# Patient Record
Sex: Female | Born: 1970 | Race: Black or African American | Hispanic: No | Marital: Single | Smoking: Never smoker
Health system: Southern US, Community
[De-identification: ages and names within clinical notes are randomized; demographics above are authoritative.]

## PROBLEM LIST (undated history)

## (undated) DIAGNOSIS — I10 Essential (primary) hypertension: Secondary | ICD-10-CM

## (undated) DIAGNOSIS — I809 Phlebitis and thrombophlebitis of unspecified site: Secondary | ICD-10-CM

## (undated) HISTORY — PX: ABDOMINAL HYSTERECTOMY: SHX81

## (undated) HISTORY — DX: Phlebitis and thrombophlebitis of unspecified site: I80.9

## (undated) HISTORY — DX: Essential (primary) hypertension: I10

---

## 1999-09-06 ENCOUNTER — Ambulatory Visit (HOSPITAL_COMMUNITY): Admission: RE | Admit: 1999-09-06 | Discharge: 1999-09-06 | Payer: Self-pay | Admitting: Surgery

## 2000-05-06 ENCOUNTER — Other Ambulatory Visit: Admission: RE | Admit: 2000-05-06 | Discharge: 2000-05-06 | Payer: Self-pay | Admitting: Gynecology

## 2000-11-13 ENCOUNTER — Emergency Department (HOSPITAL_COMMUNITY): Admission: EM | Admit: 2000-11-13 | Discharge: 2000-11-13 | Payer: Self-pay | Admitting: Emergency Medicine

## 2000-11-14 ENCOUNTER — Encounter: Payer: Self-pay | Admitting: Emergency Medicine

## 2002-12-29 ENCOUNTER — Other Ambulatory Visit: Admission: RE | Admit: 2002-12-29 | Discharge: 2002-12-29 | Payer: Self-pay | Admitting: Gynecology

## 2003-05-17 ENCOUNTER — Inpatient Hospital Stay (HOSPITAL_COMMUNITY): Admission: RE | Admit: 2003-05-17 | Discharge: 2003-05-19 | Payer: Self-pay | Admitting: Gynecology

## 2003-11-30 ENCOUNTER — Encounter: Admission: RE | Admit: 2003-11-30 | Discharge: 2003-11-30 | Payer: Self-pay | Admitting: Otolaryngology

## 2004-11-08 ENCOUNTER — Other Ambulatory Visit: Admission: RE | Admit: 2004-11-08 | Discharge: 2004-11-08 | Payer: Self-pay | Admitting: Gynecology

## 2005-11-18 HISTORY — PX: TONSILLECTOMY: SUR1361

## 2006-03-05 ENCOUNTER — Other Ambulatory Visit: Admission: RE | Admit: 2006-03-05 | Discharge: 2006-03-05 | Payer: Self-pay | Admitting: Gynecology

## 2011-02-04 ENCOUNTER — Ambulatory Visit (HOSPITAL_COMMUNITY): Admission: RE | Admit: 2011-02-04 | Payer: Self-pay | Source: Ambulatory Visit

## 2011-02-05 ENCOUNTER — Ambulatory Visit (HOSPITAL_COMMUNITY)
Admission: RE | Admit: 2011-02-05 | Discharge: 2011-02-05 | Disposition: A | Discharge status: Home / Self Care | Payer: 59 | Source: Ambulatory Visit | Attending: Specialist | Admitting: Specialist

## 2011-02-05 DIAGNOSIS — M7989 Other specified soft tissue disorders: Secondary | ICD-10-CM | POA: Insufficient documentation

## 2011-02-05 DIAGNOSIS — M79609 Pain in unspecified limb: Secondary | ICD-10-CM | POA: Insufficient documentation

## 2011-02-05 DIAGNOSIS — R52 Pain, unspecified: Secondary | ICD-10-CM

## 2011-05-28 ENCOUNTER — Encounter: Payer: Self-pay | Admitting: Surgery

## 2011-06-07 ENCOUNTER — Encounter: Payer: Self-pay | Admitting: Surgery

## 2011-06-10 ENCOUNTER — Ambulatory Visit (INDEPENDENT_AMBULATORY_CARE_PROVIDER_SITE_OTHER): Payer: 59 | Admitting: Surgery

## 2011-06-10 ENCOUNTER — Encounter: Payer: Self-pay | Admitting: Surgery

## 2011-06-10 ENCOUNTER — Ambulatory Visit (INDEPENDENT_AMBULATORY_CARE_PROVIDER_SITE_OTHER): Payer: 59 | Admitting: Vascular Surgery

## 2011-06-10 VITALS — BP 147/93 | HR 74 | Resp 16 | Ht 65.0 in | Wt 323.1 lb

## 2011-06-10 DIAGNOSIS — M7989 Other specified soft tissue disorders: Secondary | ICD-10-CM

## 2011-06-10 DIAGNOSIS — I8000 Phlebitis and thrombophlebitis of superficial vessels of unspecified lower extremity: Secondary | ICD-10-CM | POA: Insufficient documentation

## 2011-06-10 DIAGNOSIS — M79609 Pain in unspecified limb: Secondary | ICD-10-CM

## 2011-06-10 NOTE — Progress Notes (Signed)
[facility]   Patient name: Adriana Mcintosh MRN: [id number] DOB: 1970/11/08 Sex: female   Referred by: Dr. Shelle Iron  Reason for referral:  Chief Complaint  Patient presents with  . New Evaluation    Sup Phelbitis of right leg-off/on 2009. Getting worse   , HISTORY OF PRESENT ILLNESS: The patient is sent over for evaluation of right leg swelling. The patient states that she bumped her leg on an elliptical machine approximately 3 years ago and has been having problems with swelling ever sense. She states that her swelling is improved with leg elevation as well as compression from an Ace wrap. It is worse when she is on her feet all day. She has had an MRI which revealed an area consistent with a phlebitis and surrounding fluid. She is sent over for further evaluation of her venous system. She has had a Doppler study which was negative for DVT. Her other medical conditions include hypertension, obesity.  Past Medical History  Diagnosis Date  . Hypertension   . Phlebitis, superficial     chronic    Past Surgical History  Procedure Date  . Abdominal hysterectomy   . Tonsillectomy Oct. 2007  . Cesarean section     History   Social History  . Marital Status: Single    Spouse Name: N/A    Number of Children: N/A  . Years of Education: N/A   Occupational History  . Not on file.   Social History Main Topics  . Smoking status: Never Smoker   . Smokeless tobacco: Not on file  . Alcohol Use: 0.6 oz/week    1 Glasses of wine per week  . Drug Use: No  . Sexually Active:    Other Topics Concern  . Not on file   Social History Narrative  . No narrative on file    Family History  Problem Relation Age of Onset  . Hypertension Mother   . Stroke Father   . Diabetes Father   . Heart disease Father   . Hypertension Father   . Diabetes Brother     Allergies as of 06/10/2011 - Review Complete 06/10/2011  Allergen Reaction Noted  . Levaquin   05/28/2011    Current Outpatient Prescriptions on File Prior to Visit  Medication Sig Dispense Refill  . lisinopril-hydrochlorothiazide (PRINZIDE,ZESTORETIC) 10-12.5 MG per tablet Take 1 tablet by mouth daily.         REVIEW OF SYSTEMS: Review of systems positive for swelling in her legs and varicose veins. Otherwise, negative as documented in the encounter for  PHYSICAL EXAMINATION: General: The patient appears their stated age.  Vital signs are BP 147/93  Pulse 74  Resp 16  Ht 5\' 5"  (1.651 m)  Wt 323 lb 1.6 oz (146.557 kg)  BMI 53.77 kg/m2  SpO2 100% HEENT:  No gross abnormalities Pulmonary: Respirations are non-labored Musculoskeletal: There are no major deformities.   Neurologic: No focal weakness or paresthesias are detected, Skin: There are no ulcer or rashes noted. Psychiatric: The patient has normal affect. Cardiovascular: Palpable dorsalis pedis pulse on the right. Swelling on the medial half of the right lower leg  Diagnostic Studies: Venous ultrasound was performed which showed no evidence of deep vein thrombosis on the right. There was reflux in the right common femoral vein however the greater and lesser saphenous veins were competent.    Medication Changes: None  Assessment:  Right leg swelling Plan: With a venous ultrasound which did not show  evidence of superficial venous reflux, I feel that her symptoms are most likely consistent with lymphedema, especially given her history of trauma 3 years ago which is when she developed the onset of her symptoms. I discussed that the main stay of treatment for this is going to be elevation and compression. I have recommended compression stockings however she did not wish to have these at this time. Because of the somewhat focal nature of her swelling, she may benefit from consultation with lymphedema therapist. I have made an appointment for her. She will followup with me on a when necessary basis     V. Charlena Cross, M.D. [facility] [place] Office: [phone] Pager:  [phone]

## 2011-06-24 NOTE — Procedures (Unsigned)
DUPLEX DEEP VENOUS EXAM - LOWER EXTREMITY  INDICATION:  Right lower extremity pain and swelling.  HISTORY:  Edema:  Yes. Trauma/Surgery:  Injury on exercise equipment, unknown date. Pain:  Yes. PE:  No. Previous DVT:  No. Anticoagulants:  No. Other:  DUPLEX EXAM:               CFV   SFV   PopV   PTV   GSV               R  L  R  L  R   L  R  L  R  L Thrombosis    0  0  0     0      0     0 Spontaneous   +  +  +     +      +     + Phasic        +  +  +     +      +     + Augmentation  +  +  +     +      +     + Compressible  +  +  +.    +      +     + Competent     0  +  +     +      +     +  Legend:  + - yes  o - no  p - partial  D - decreased   IMPRESSION: 1. No evidence of deep vein thrombosis identified in the right lower     extremity. 2. Deep venous reflux present involving the right common femoral vein     of >500 milliseconds. 3. Patent and competent right greater and small saphenous veins. 4. Patent and compressible contralateral common femoral vein.        _____________________________ V. Charlena Cross, MD  SH/MEDQ  D:  06/10/2011  T:  06/10/2011  Job:  [id number]

## 2013-12-28 ENCOUNTER — Ambulatory Visit: Payer: 59

## 2014-01-04 ENCOUNTER — Ambulatory Visit: Payer: 59

## 2014-01-11 ENCOUNTER — Ambulatory Visit: Payer: 59

## 2018-03-09 ENCOUNTER — Other Ambulatory Visit: Payer: Self-pay | Admitting: Family Medicine

## 2018-03-09 DIAGNOSIS — Z1231 Encounter for screening mammogram for malignant neoplasm of breast: Secondary | ICD-10-CM

## 2018-04-03 ENCOUNTER — Ambulatory Visit
Admission: RE | Admit: 2018-04-03 | Discharge: 2018-04-03 | Disposition: A | Discharge status: Home / Self Care | Payer: Managed Care, Other (non HMO) | Source: Ambulatory Visit | Attending: Family Medicine | Admitting: Family Medicine

## 2018-04-03 DIAGNOSIS — Z1231 Encounter for screening mammogram for malignant neoplasm of breast: Secondary | ICD-10-CM

## 2019-03-31 ENCOUNTER — Other Ambulatory Visit: Payer: Self-pay | Admitting: Family Medicine

## 2019-03-31 DIAGNOSIS — Z1231 Encounter for screening mammogram for malignant neoplasm of breast: Secondary | ICD-10-CM

## 2019-04-06 ENCOUNTER — Emergency Department (HOSPITAL_COMMUNITY)
Admission: EM | Admit: 2019-04-06 | Discharge: 2019-04-07 | Disposition: A | Discharge status: Home / Self Care | Payer: Managed Care, Other (non HMO) | Attending: Emergency Medicine | Admitting: Emergency Medicine

## 2019-04-06 ENCOUNTER — Emergency Department (HOSPITAL_COMMUNITY): Payer: Managed Care, Other (non HMO)

## 2019-04-06 ENCOUNTER — Other Ambulatory Visit: Payer: Self-pay

## 2019-04-06 ENCOUNTER — Encounter (HOSPITAL_COMMUNITY): Payer: Self-pay | Admitting: Emergency Medicine

## 2019-04-06 DIAGNOSIS — R202 Paresthesia of skin: Secondary | ICD-10-CM | POA: Diagnosis not present

## 2019-04-06 DIAGNOSIS — G43909 Migraine, unspecified, not intractable, without status migrainosus: Secondary | ICD-10-CM

## 2019-04-06 DIAGNOSIS — I10 Essential (primary) hypertension: Secondary | ICD-10-CM

## 2019-04-06 DIAGNOSIS — R609 Edema, unspecified: Secondary | ICD-10-CM | POA: Insufficient documentation

## 2019-04-06 DIAGNOSIS — R200 Anesthesia of skin: Secondary | ICD-10-CM | POA: Diagnosis present

## 2019-04-06 LAB — DIFFERENTIAL
Abs Immature Granulocytes: 0.02 10*3/uL (ref 0.00–0.07)
Basophils Absolute: 0 10*3/uL (ref 0.0–0.1)
Basophils Relative: 1 %
Eosinophils Absolute: 0.1 10*3/uL (ref 0.0–0.5)
Eosinophils Relative: 1 %
Immature Granulocytes: 0 %
Lymphocytes Relative: 48 %
Lymphs Abs: 3.6 10*3/uL (ref 0.7–4.0)
Monocytes Absolute: 0.5 10*3/uL (ref 0.1–1.0)
Monocytes Relative: 6 %
Neutro Abs: 3.4 10*3/uL (ref 1.7–7.7)
Neutrophils Relative %: 44 %

## 2019-04-06 LAB — CBC
HCT: 41 % (ref 36.0–46.0)
Hemoglobin: 13 g/dL (ref 12.0–15.0)
MCH: 28.6 pg (ref 26.0–34.0)
MCHC: 31.7 g/dL (ref 30.0–36.0)
MCV: 90.3 fL (ref 80.0–100.0)
Platelets: 405 10*3/uL — ABNORMAL HIGH (ref 150–400)
RBC: 4.54 MIL/uL (ref 3.87–5.11)
RDW: 12.9 % (ref 11.5–15.5)
WBC: 7.6 10*3/uL (ref 4.0–10.5)
nRBC: 0 % (ref 0.0–0.2)

## 2019-04-06 LAB — APTT: aPTT: 32 seconds (ref 24–36)

## 2019-04-06 LAB — PROTIME-INR
INR: 1 (ref 0.8–1.2)
Prothrombin Time: 12.6 seconds (ref 11.4–15.2)

## 2019-04-06 MED ORDER — SODIUM CHLORIDE 0.9% FLUSH
3.0000 mL | Freq: Once | INTRAVENOUS | Status: DC
Start: 2019-04-06 — End: 2019-04-07

## 2019-04-06 NOTE — ED Triage Notes (Signed)
Patient reports brief numbness at left face and left arm this evening ( 9PM) , alert and oriented at arrival , speech clear/ no facial asymmetry , equal strong grips with no arm drift , alert and oriented/respirations unlabored.

## 2019-04-07 LAB — COMPREHENSIVE METABOLIC PANEL
ALT: 17 U/L (ref 0–44)
AST: 13 U/L — ABNORMAL LOW (ref 15–41)
Albumin: 4.3 g/dL (ref 3.5–5.0)
Alkaline Phosphatase: 49 U/L (ref 38–126)
Anion gap: 13 (ref 5–15)
BUN: 12 mg/dL (ref 6–20)
CO2: 25 mmol/L (ref 22–32)
Calcium: 9.4 mg/dL (ref 8.9–10.3)
Chloride: 102 mmol/L (ref 98–111)
Creatinine, Ser: 0.75 mg/dL (ref 0.44–1.00)
GFR calc Af Amer: >60 mL/min (ref >60)
GFR calc non Af Amer: >60 mL/min (ref >60)
Glucose, Bld: 190 mg/dL — ABNORMAL HIGH (ref 70–99)
Potassium: 3.6 mmol/L (ref 3.5–5.1)
Sodium: 140 mmol/L (ref 135–145)
Total Bilirubin: 0.5 mg/dL (ref 0.3–1.2)
Total Protein: 7.1 g/dL (ref 6.5–8.1)

## 2019-04-07 MED ORDER — KETOROLAC TROMETHAMINE 30 MG/ML IJ SOLN
30.0000 mg | Freq: Once | INTRAMUSCULAR | Status: AC
  Administered 2019-04-07: 30 mg via INTRAMUSCULAR
  Filled 2019-04-07: qty 1

## 2019-04-07 NOTE — ED Provider Notes (Signed)
[facility] EMERGENCY DEPARTMENT Provider Note   CSN: [id number] Arrival date & time: 04/06/19  2210     History Chief Complaint  Patient presents with  . Facial Numbness    Adriana Mcintosh is a 49 y.o. female.  HPI     This is a 49 year old female with a history of hypertension who presents with numbness.  Patient reports that last night after giving her husband a bath she noted left hand numbness.  She states that it migrated up her left arm and she had some numbness over the left side of her lips.  She states that the numbness lasted approximately 15 minutes.  It resolved in route here.  She did not have any headache at that time but states that after getting her CT scan here she developed a headache behind her right eye.  No vision changes or speech difficulty.  No weakness, gait disturbance.  Denies history of migraines.  Currently she rates her headache at 4 out of 10.  Denies any recent fevers, cough, upper respiratory illness.  Past Medical History:  Diagnosis Date  . Hypertension   . Phlebitis, superficial    chronic    Patient Active Problem List   Diagnosis Date Noted  . Phlebitis and thrombophlebitis of superficial vessels of lower extremities 06/10/2011    Past Surgical History:  Procedure Laterality Date  . ABDOMINAL HYSTERECTOMY    . CESAREAN SECTION    . TONSILLECTOMY  Oct. 2007     OB History   No obstetric history on file.     Family History  Problem Relation Age of Onset  . Hypertension Mother   . Stroke Father   . Diabetes Father   . Heart disease Father   . Hypertension Father   . Diabetes Brother     Social History   Tobacco Use  . Smoking status: Never Smoker  Substance Use Topics  . Alcohol use: Yes    Alcohol/week: 1.0 standard drinks    Types: 1 Glasses of wine per week  . Drug use: No    Home Medications Prior to Admission medications   Medication Sig Start Date End Date Taking? Authorizing Provider    HYDROcodone-acetaminophen (NORCO) 7.5-325 MG per tablet Take 7.5-325 mg by mouth Ad lib. For pain 04/23/11   [provider]  lisinopril-hydrochlorothiazide (PRINZIDE,ZESTORETIC) 10-12.5 MG per tablet Take 1 tablet by mouth daily.    [provider]    Allergies    Levofloxacin  Review of Systems   Review of Systems  Constitutional: Negative for fever.  Respiratory: Negative for shortness of breath.   Cardiovascular: Negative for chest pain.  Gastrointestinal: Negative for abdominal pain, nausea and vomiting.  Genitourinary: Negative for dysuria.  Neurological: Positive for numbness and headaches. Negative for dizziness and weakness.  All other systems reviewed and are negative.   Physical Exam Updated Vital Signs BP (!) 151/93   Pulse 73   Temp 98.1 F (36.7 C) (Oral)   Resp 20   SpO2 97%   Physical Exam Vitals and nursing note reviewed.  Constitutional:      Appearance: She is well-developed. She is obese. She is not ill-appearing.  HENT:     Head: Normocephalic and atraumatic.     Nose: Nose normal.     Mouth/Throat:     Mouth: Mucous membranes are moist.  Eyes:     Extraocular Movements: Extraocular movements intact.     Pupils: Pupils are equal, round, and reactive  to light.  Cardiovascular:     Rate and Rhythm: Normal rate and regular rhythm.     Heart sounds: Normal heart sounds.  Pulmonary:     Effort: Pulmonary effort is normal. No respiratory distress.     Breath sounds: No wheezing.  Abdominal:     General: Bowel sounds are normal.     Palpations: Abdomen is soft.  Musculoskeletal:     Cervical back: Neck supple.     Right lower leg: Edema present.     Left lower leg: Edema present.  Skin:    General: Skin is warm and dry.  Neurological:     Mental Status: She is alert and oriented to person, place, and time.     Comments: Cranial nerves II through XII intact, fluent speech, 5 out of 5 strength in all 4 extremities, no dysmetria to  finger-nose-finger, sensation intact to light touch all 4 extremities  Psychiatric:        Mood and Affect: Mood normal.     ED Results / Procedures / Treatments   Labs (all labs ordered are listed, but only abnormal results are displayed) Labs Reviewed  CBC - Abnormal; Notable for the following components:      Result Value   Platelets 405 (*)    All other components within normal limits  COMPREHENSIVE METABOLIC PANEL - Abnormal; Notable for the following components:   Glucose, Bld 190 (*)    AST 13 (*)    All other components within normal limits  PROTIME-INR  APTT  DIFFERENTIAL    EKG None  Radiology CT HEAD WO CONTRAST  Result Date: 04/06/2019 CLINICAL DATA:  Numbness of the left face and arm EXAM: CT HEAD WITHOUT CONTRAST TECHNIQUE: Contiguous axial images were obtained from the base of the skull through the vertex without intravenous contrast. COMPARISON:  None. FINDINGS: Brain: No acute territorial infarction, hemorrhage, or intracranial mass. The ventricles are nonenlarged. Vascular: No hyperdense vessels.  Carotid vascular calcification Skull: Normal. Negative for fracture or focal lesion. Sinuses/Orbits: No acute finding. Other: None IMPRESSION: Negative non contrasted CT appearance of the brain Electronically Signed   By: Jasmine Pang M.D.   On: 04/06/2019 22:42    Procedures Procedures (including critical care time)  Medications Ordered in ED Medications  sodium chloride flush (NS) 0.9 % injection 3 mL (3 mLs Intravenous Not Given 04/07/19 0321)  ketorolac (TORADOL) 30 MG/ML injection 30 mg (30 mg Intramuscular Given 04/07/19 0331)    ED Course  I have reviewed the triage vital signs and the nursing notes.  Pertinent labs & imaging results that were available during my care of the patient were reviewed by me and considered in my medical decision making (see chart for details).    MDM Rules/Calculators/A&P                       Patient presents with  paresthesia followed by headache.  She is overall nontoxic-appearing.  Vital signs notable for hypertension.  Patient denies history of hypertension but there is a history listed in her chart.  Currently she only has some residual right-sided headache.  No history of migraines.  No red flags.  Neurologic exam is normal.  She does not have any persistent numbness.  Have low suspicion at this time for TIA given her age and risk factors.  Would suspect more likely complex migraine.  Low suspicion for subarachnoid hemorrhage.  She was hypertensive but blood pressures down trended.  Recommend  follow-up with her primary physician for blood pressure recheck and possible initiation of medications.  Doubt hypertensive urgency or emergency.  Neurology and PCP follow-up recommended.  After history, exam, and medical workup I feel the patient has been appropriately medically screened and is safe for discharge home. Pertinent diagnoses were discussed with the patient. Patient was given return precautions.   Final Clinical Impression(s) / ED Diagnoses Final diagnoses:  Paresthesia  Migraine without status migrainosus, not intractable, unspecified migraine type  Essential hypertension    Rx / DC Orders ED Discharge Orders    None       Shon Baton, MD 04/07/19 [phone]

## 2019-04-07 NOTE — Discharge Instructions (Addendum)
You were seen today for numbness followed by headache.  Your work-up is reassuring including CT scan of your head.  You were noted to be hypertensive.  Follow-up closely with your primary doctor for blood pressure recheck and initiation of medications.  Additionally, follow-up with neurology.  Your symptoms were atypical but could represent a complex migraine.  Less likely thought to be a TIA.

## 2019-04-30 ENCOUNTER — Ambulatory Visit
Admission: RE | Admit: 2019-04-30 | Discharge: 2019-04-30 | Disposition: A | Discharge status: Home / Self Care | Payer: Managed Care, Other (non HMO) | Source: Ambulatory Visit | Attending: Family Medicine | Admitting: Family Medicine

## 2019-04-30 ENCOUNTER — Other Ambulatory Visit: Payer: Self-pay

## 2019-04-30 DIAGNOSIS — Z1231 Encounter for screening mammogram for malignant neoplasm of breast: Secondary | ICD-10-CM

## 2020-03-20 ENCOUNTER — Other Ambulatory Visit: Payer: Self-pay | Admitting: Family Medicine

## 2020-03-20 DIAGNOSIS — Z1231 Encounter for screening mammogram for malignant neoplasm of breast: Secondary | ICD-10-CM

## 2020-05-04 ENCOUNTER — Ambulatory Visit: Payer: Managed Care, Other (non HMO)

## 2020-06-26 ENCOUNTER — Ambulatory Visit: Payer: Managed Care, Other (non HMO)

## 2020-07-06 ENCOUNTER — Ambulatory Visit: Payer: Managed Care, Other (non HMO)

## 2020-09-16 ENCOUNTER — Ambulatory Visit
Admission: RE | Admit: 2020-09-16 | Discharge: 2020-09-16 | Disposition: A | Discharge status: Home / Self Care | Payer: Managed Care, Other (non HMO) | Source: Ambulatory Visit | Attending: Family Medicine | Admitting: Family Medicine

## 2020-09-16 ENCOUNTER — Other Ambulatory Visit: Payer: Self-pay

## 2020-09-16 DIAGNOSIS — Z1231 Encounter for screening mammogram for malignant neoplasm of breast: Secondary | ICD-10-CM

## 2021-08-31 ENCOUNTER — Other Ambulatory Visit: Payer: Self-pay | Admitting: Family Medicine

## 2021-08-31 DIAGNOSIS — Z1231 Encounter for screening mammogram for malignant neoplasm of breast: Secondary | ICD-10-CM

## 2021-09-20 ENCOUNTER — Ambulatory Visit
Admission: RE | Admit: 2021-09-20 | Discharge: 2021-09-20 | Disposition: A | Discharge status: Home / Self Care | Payer: Managed Care, Other (non HMO) | Source: Ambulatory Visit | Attending: Family Medicine | Admitting: Family Medicine

## 2021-09-20 DIAGNOSIS — Z1231 Encounter for screening mammogram for malignant neoplasm of breast: Secondary | ICD-10-CM

## 2022-05-09 ENCOUNTER — Ambulatory Visit: Payer: Managed Care, Other (non HMO) | Admitting: Plastic Surgery

## 2022-05-09 ENCOUNTER — Encounter: Payer: Self-pay | Admitting: Plastic Surgery

## 2022-05-09 VITALS — BP 134/86 | HR 73 | Ht 65.0 in | Wt 256.2 lb

## 2022-05-09 DIAGNOSIS — E65 Localized adiposity: Secondary | ICD-10-CM

## 2022-05-09 DIAGNOSIS — W2189XS Striking against or struck by other sports equipment, sequela: Secondary | ICD-10-CM

## 2022-05-09 NOTE — Progress Notes (Signed)
Referring Provider Adriana Ada, MD Adriana Mcintosh,  Adriana Mcintosh [id number]   CC:  Chief Complaint  Patient presents with   Advice Only      Adriana Mcintosh is an 52 y.o. female.  HPI: Adriana Mcintosh is a very nice 52 year old female who presents today for evaluation of subcutaneous fat on the medial aspect of the right leg just above the ankle.  The patient states that she hit her leg approximately 2 years ago on a elliptical machine and had some swelling in the area afterwards.  She denies any pain with this area and has not noted any significant change since that time.  She currently has no specific complaints.  An MRI obtained on October 30, 2020 revealed 9 circumscribed fat-containing mass in the medial subcutaneous fat with overlying skin thickening and postcontrast enhancement and surrounding soft tissue edema.  The differential diagnosis at that time favored fat necrosis or subcutaneous panniculitis.  Allergies  Allergen Reactions   Levofloxacin     Rapid pulse    Outpatient Encounter Medications as of 05/09/2022  Medication Sig   lisinopril-hydrochlorothiazide (PRINZIDE,ZESTORETIC) 10-12.5 MG per tablet Take 1 tablet by mouth daily.   metFORMIN (GLUCOPHAGE-XR) 500 MG 24 hr tablet Take 500 mg by mouth daily with breakfast.   MOUNJARO 7.5 MG/0.5ML Pen Inject 7.5 mg into the skin once a week.   rosuvastatin (CRESTOR) 5 MG tablet Take 5 mg by mouth daily.   [DISCONTINUED] HYDROcodone-acetaminophen (NORCO) 7.5-325 MG per tablet Take 7.5-325 mg by mouth Ad lib. For pain   No facility-administered encounter medications on file as of 05/09/2022.     Past Medical History:  Diagnosis Date   Hypertension    Phlebitis, superficial    chronic    Past Surgical History:  Procedure Laterality Date   ABDOMINAL HYSTERECTOMY     CESAREAN SECTION     TONSILLECTOMY  Oct. 2007    Family History  Problem Relation Age of Onset   Hypertension Mother    Stroke Father     Diabetes Father    Heart disease Father    Hypertension Father    Diabetes Brother    Breast cancer Neg Hx     Social History   Social History Narrative   Not on file     Review of Systems General: Denies fevers, chills, weight loss CV: Denies chest pain, shortness of breath, palpitations Right lower extremity: The area that the patient indicates is the area of concern shows a very small amount of fat atrophy between the distal portion of the medial gastroc and the medial malleolus.  Physical Exam    05/09/2022    8:20 AM 04/07/2019    4:00 AM 04/07/2019    3:31 AM  Vitals with BMI  Height 5\' 5"     Weight 256 lbs 3 oz    BMI [id number]    Systolic [id number]  Diastolic 86 83 93  Pulse 73 73 73    General:  No acute distress,  Alert and oriented, Non-Toxic, Normal speech and affect Right lower extremity: As noted there is a area of what appears to be atrophy between the distal gastroc and medial malleolus.  I do not feel any discrete mass in this area and the patient has no pain on palpation.  The skin in the area is somewhat atrophic. Mammogram: Not applicable Assessment/Plan Right lower extremity mass: I do not palpate a discrete mass in the area and the MRI  from almost 2 years ago does not show a discrete well-circumscribed mass either.  At this point since there is been no increase in size and the patient has no specific complaints I have recommended that we just follow this clinically.  If the area begins to increase in size she is encouraged to return and I will biopsy the fat in that area.  She verbalizes understanding and agrees with the plan.  Camillia Herter 05/09/2022, 8:54 AM

## 2022-05-21 ENCOUNTER — Other Ambulatory Visit: Payer: Self-pay

## 2022-05-22 ENCOUNTER — Other Ambulatory Visit: Payer: Self-pay

## 2022-05-22 MED ORDER — MOUNJARO 7.5 MG/0.5ML ~~LOC~~ SOAJ
SUBCUTANEOUS | 0 refills | Status: DC
  Filled 2022-05-22 (×2): qty 2, 28d supply, fill #0
  Filled 2022-06-19: qty 2, 28d supply, fill #1
  Filled 2022-07-16: qty 2, 28d supply, fill #2

## 2022-05-23 ENCOUNTER — Other Ambulatory Visit: Payer: Self-pay

## 2022-07-16 ENCOUNTER — Other Ambulatory Visit: Payer: Self-pay

## 2022-07-17 ENCOUNTER — Other Ambulatory Visit: Payer: Self-pay

## 2022-07-18 ENCOUNTER — Other Ambulatory Visit: Payer: Self-pay

## 2022-07-18 MED ORDER — PEG 3350-KCL-NA BICARB-NACL 420 G PO SOLR
ORAL | 0 refills | Status: AC
  Filled 2022-07-18: qty 4000, 30d supply, fill #0

## 2022-08-08 ENCOUNTER — Other Ambulatory Visit: Payer: Self-pay | Admitting: Family Medicine

## 2022-08-08 DIAGNOSIS — Z1231 Encounter for screening mammogram for malignant neoplasm of breast: Secondary | ICD-10-CM

## 2022-08-15 ENCOUNTER — Other Ambulatory Visit: Payer: Self-pay

## 2022-08-15 MED ORDER — MOUNJARO 7.5 MG/0.5ML ~~LOC~~ SOAJ
7.5000 mg | SUBCUTANEOUS | 0 refills | Status: DC
  Filled 2022-08-15: qty 2, 28d supply, fill #0
  Filled 2022-09-12 – 2022-09-19 (×2): qty 2, 28d supply, fill #1
  Filled 2022-10-17: qty 2, 28d supply, fill #2

## 2022-08-19 ENCOUNTER — Other Ambulatory Visit: Payer: Self-pay

## 2022-09-10 ENCOUNTER — Other Ambulatory Visit: Payer: Self-pay

## 2022-09-18 ENCOUNTER — Other Ambulatory Visit: Payer: Self-pay

## 2022-09-19 ENCOUNTER — Other Ambulatory Visit: Payer: Self-pay

## 2022-09-20 ENCOUNTER — Ambulatory Visit
Admission: RE | Admit: 2022-09-20 | Discharge: 2022-09-20 | Disposition: A | Discharge status: Home / Self Care | Payer: Managed Care, Other (non HMO) | Source: Ambulatory Visit | Attending: Family Medicine | Admitting: Family Medicine

## 2022-09-20 DIAGNOSIS — Z1231 Encounter for screening mammogram for malignant neoplasm of breast: Secondary | ICD-10-CM

## 2022-10-22 ENCOUNTER — Other Ambulatory Visit: Payer: Self-pay

## 2022-11-14 ENCOUNTER — Other Ambulatory Visit: Payer: Self-pay

## 2022-11-15 ENCOUNTER — Other Ambulatory Visit: Payer: Self-pay

## 2022-11-15 MED ORDER — MOUNJARO 7.5 MG/0.5ML ~~LOC~~ SOAJ
7.5000 mg | SUBCUTANEOUS | 0 refills | Status: AC
  Filled 2022-11-15: qty 2, 28d supply, fill #0
  Filled 2022-12-17: qty 2, 28d supply, fill #1
  Filled 2023-01-16: qty 2, 28d supply, fill #2
  Filled 2023-02-14 (×2): qty 2, 28d supply, fill #3
  Filled 2023-03-10: qty 2, 28d supply, fill #4
  Filled 2023-04-07: qty 2, 28d supply, fill #5
  Filled 2023-05-06: qty 2, 28d supply, fill #6
  Filled 2023-05-09: qty 1, 14d supply, fill #6

## 2022-11-20 ENCOUNTER — Other Ambulatory Visit: Payer: Self-pay

## 2022-12-17 ENCOUNTER — Other Ambulatory Visit: Payer: Self-pay

## 2022-12-18 ENCOUNTER — Other Ambulatory Visit: Payer: Self-pay

## 2022-12-19 ENCOUNTER — Other Ambulatory Visit: Payer: Self-pay

## 2022-12-31 IMAGING — MG MM DIGITAL SCREENING BILAT W/ TOMO AND CAD
6 of 10 series · 6 of 30 positions shown · non-contrast
Comparison: Previous exam(s).

CLINICAL DATA: Screening.

EXAM:
DIGITAL SCREENING BILATERAL MAMMOGRAM WITH TOMOSYNTHESIS AND CAD
TECHNIQUE: Bilateral screening digital craniocaudal and mediolateral oblique
mammograms were obtained. Bilateral screening digital breast
tomosynthesis was performed. The images were evaluated with
computer-aided detection.

[L MLO synth-2D (1 of 2)]
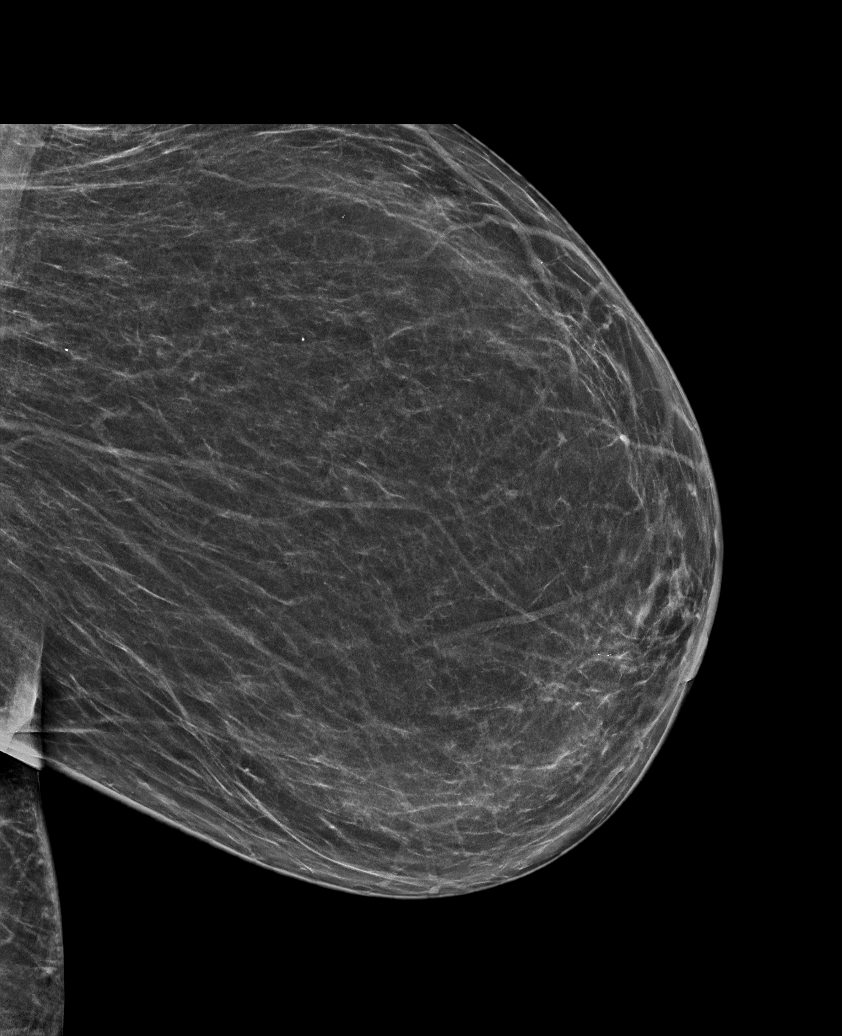

[L CC synth-2D]
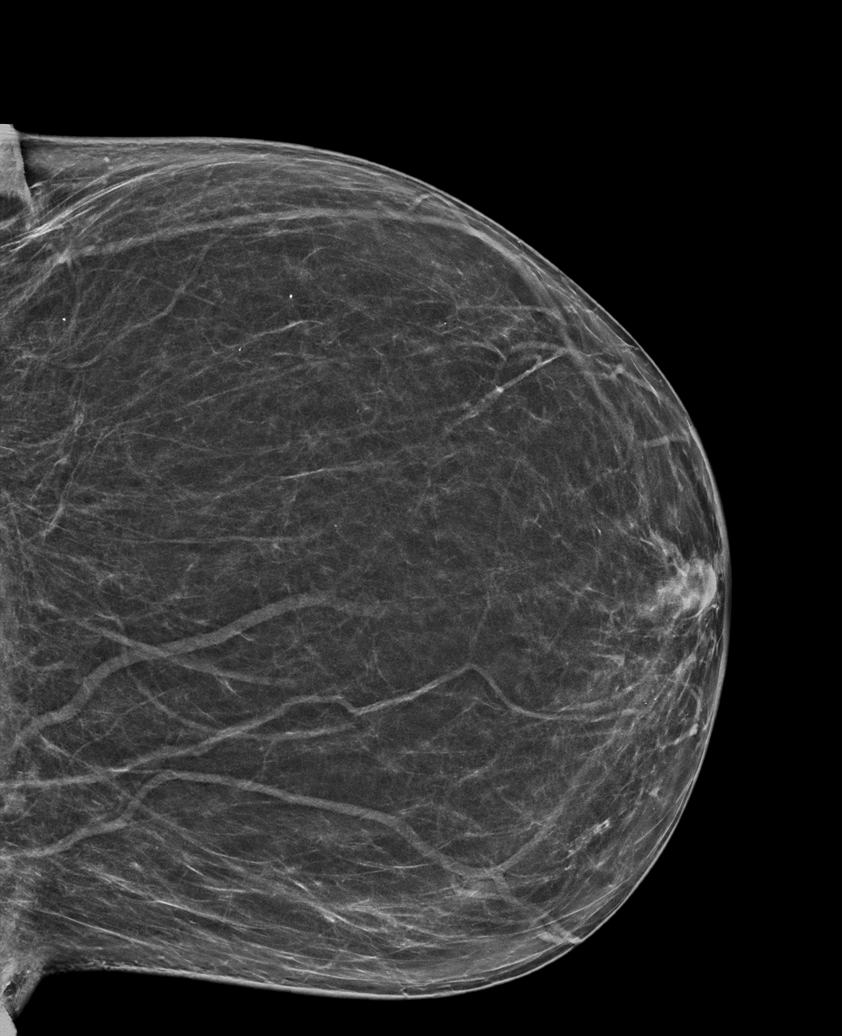

[L MLO synth-2D (2 of 2)]
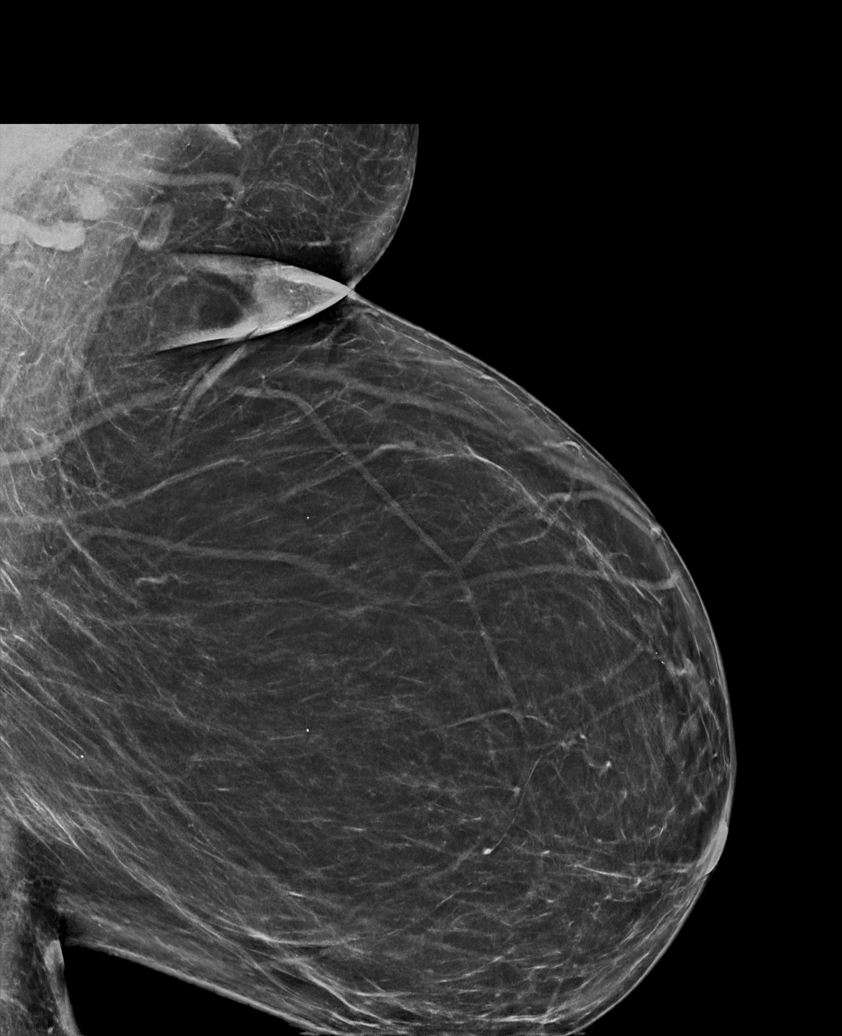

[R MLO synth-2D]
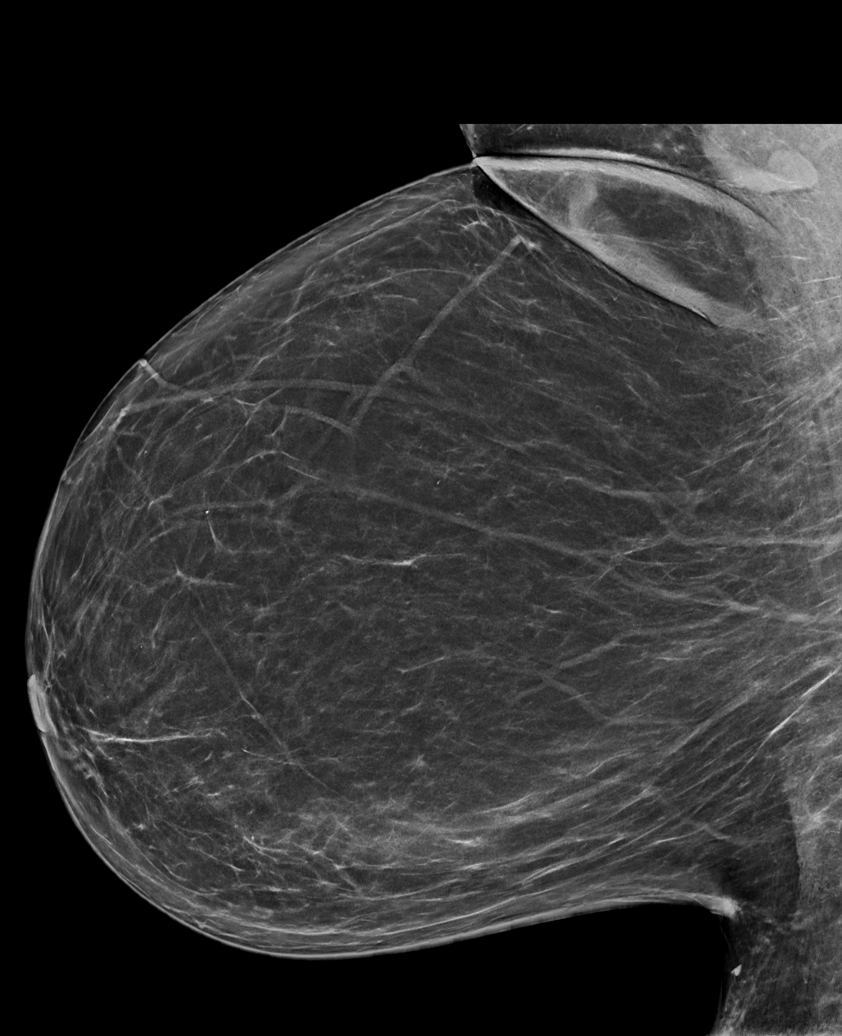

[R CC synth-2D]
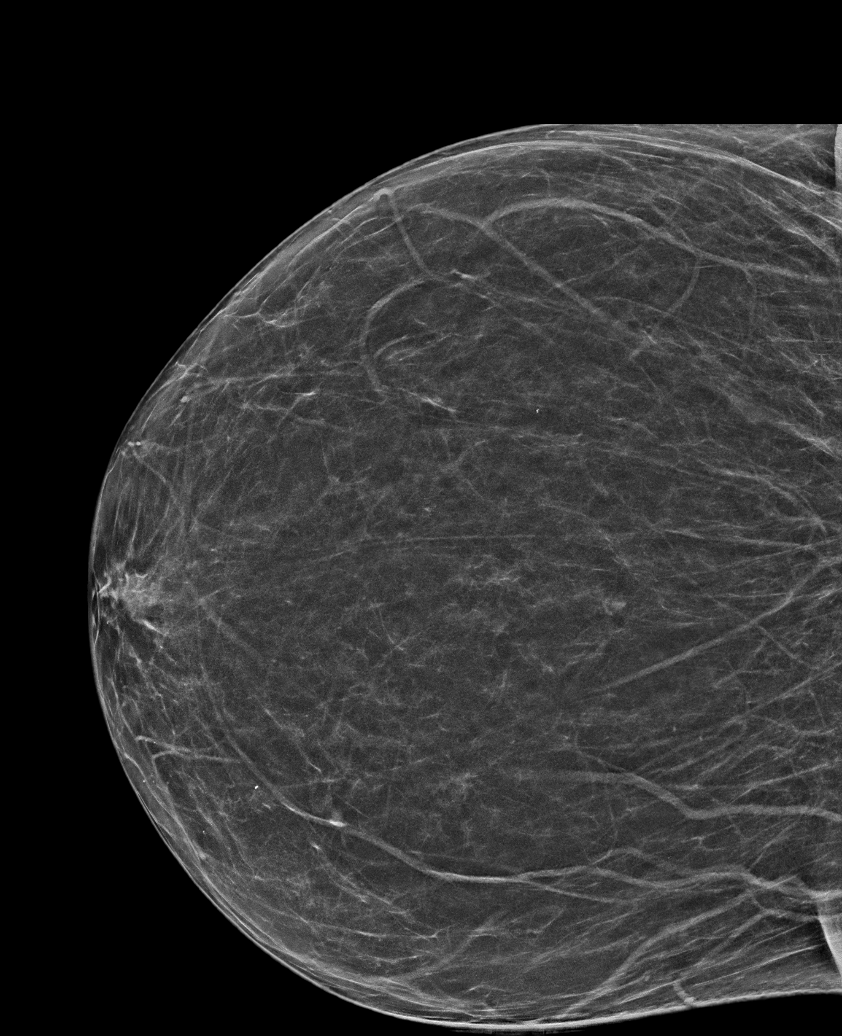

[L MLO tomo · tomo slice 44/87.0]
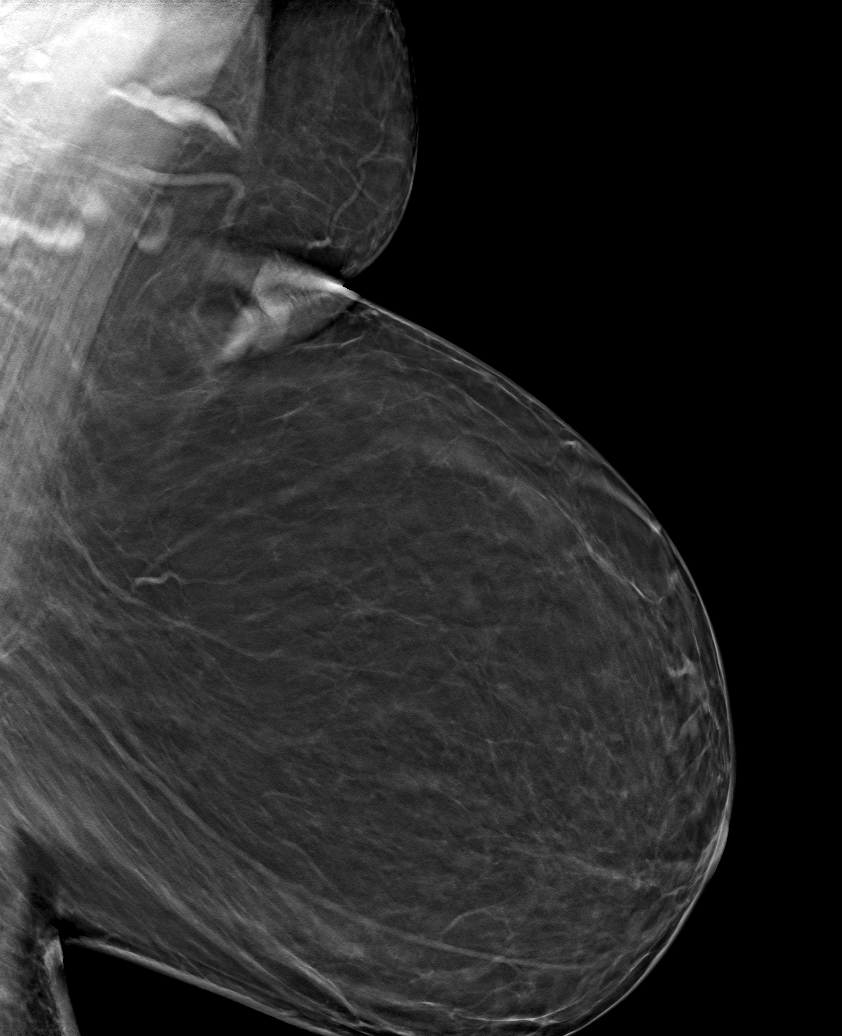

[6 of 30 positions shown; findings below may reference images not displayed]

ACR Breast Density Category b: There are scattered areas of
fibroglandular density.
FINDINGS: There are no findings suspicious for malignancy.
IMPRESSION: No mammographic evidence of malignancy. A result letter of this
screening mammogram will be mailed directly to the patient.

RECOMMENDATION:
Screening mammogram in one year. (Code:51-O-LD2)

BI-RADS CATEGORY  1: Negative.

## 2023-01-17 ENCOUNTER — Other Ambulatory Visit: Payer: Self-pay

## 2023-02-14 ENCOUNTER — Other Ambulatory Visit: Payer: Self-pay

## 2023-03-13 ENCOUNTER — Other Ambulatory Visit: Payer: Self-pay

## 2023-04-08 ENCOUNTER — Other Ambulatory Visit: Payer: Self-pay

## 2023-04-16 ENCOUNTER — Other Ambulatory Visit: Payer: Self-pay

## 2023-05-06 ENCOUNTER — Other Ambulatory Visit: Payer: Self-pay

## 2023-05-09 ENCOUNTER — Other Ambulatory Visit: Payer: Self-pay

## 2023-05-13 ENCOUNTER — Other Ambulatory Visit: Payer: Self-pay

## 2023-05-13 MED ORDER — MOUNJARO 7.5 MG/0.5ML ~~LOC~~ SOAJ
7.5000 mg | SUBCUTANEOUS | 0 refills | Status: AC
  Filled 2023-05-13: qty 2, 28d supply, fill #0
  Filled 2023-06-05: qty 2, 28d supply, fill #1

## 2023-05-22 ENCOUNTER — Other Ambulatory Visit: Payer: Self-pay

## 2023-06-11 ENCOUNTER — Other Ambulatory Visit: Payer: Self-pay

## 2023-06-17 ENCOUNTER — Other Ambulatory Visit: Payer: Self-pay

## 2023-06-26 ENCOUNTER — Other Ambulatory Visit: Payer: Self-pay

## 2023-06-26 MED ORDER — MOUNJARO 10 MG/0.5ML ~~LOC~~ SOAJ
10.0000 mg | SUBCUTANEOUS | 1 refills | Status: AC
  Filled 2023-07-04: qty 2, 28d supply, fill #0
  Filled 2023-08-04: qty 2, 28d supply, fill #1
  Filled 2023-09-03: qty 2, 28d supply, fill #2
  Filled 2023-09-29: qty 2, 28d supply, fill #3
  Filled 2023-10-28: qty 2, 28d supply, fill #4
  Filled 2023-11-29: qty 2, 28d supply, fill #5

## 2023-07-04 ENCOUNTER — Other Ambulatory Visit: Payer: Self-pay

## 2023-07-09 ENCOUNTER — Other Ambulatory Visit: Payer: Self-pay

## 2023-07-16 ENCOUNTER — Other Ambulatory Visit: Payer: Self-pay

## 2023-08-04 ENCOUNTER — Other Ambulatory Visit: Payer: Self-pay

## 2023-08-28 ENCOUNTER — Other Ambulatory Visit: Payer: Self-pay | Admitting: Family Medicine

## 2023-08-28 DIAGNOSIS — Z1231 Encounter for screening mammogram for malignant neoplasm of breast: Secondary | ICD-10-CM

## 2023-09-04 ENCOUNTER — Other Ambulatory Visit: Payer: Self-pay

## 2023-09-24 ENCOUNTER — Ambulatory Visit
Admission: RE | Admit: 2023-09-24 | Discharge: 2023-09-24 | Disposition: A | Discharge status: Home / Self Care | Source: Ambulatory Visit | Attending: Family Medicine | Admitting: Family Medicine

## 2023-09-24 DIAGNOSIS — Z1231 Encounter for screening mammogram for malignant neoplasm of breast: Secondary | ICD-10-CM

## 2023-09-30 ENCOUNTER — Other Ambulatory Visit: Payer: Self-pay

## 2023-10-03 ENCOUNTER — Other Ambulatory Visit: Payer: Self-pay

## 2023-10-06 ENCOUNTER — Other Ambulatory Visit: Payer: Self-pay

## 2023-10-09 ENCOUNTER — Other Ambulatory Visit: Payer: Self-pay

## 2023-11-03 ENCOUNTER — Other Ambulatory Visit: Payer: Self-pay

## 2023-11-06 ENCOUNTER — Other Ambulatory Visit: Payer: Self-pay

## 2023-11-07 ENCOUNTER — Other Ambulatory Visit: Payer: Self-pay | Admitting: Family Medicine

## 2023-11-07 DIAGNOSIS — R2232 Localized swelling, mass and lump, left upper limb: Secondary | ICD-10-CM

## 2023-11-10 ENCOUNTER — Other Ambulatory Visit: Payer: Self-pay | Admitting: Family Medicine

## 2023-11-10 DIAGNOSIS — R2232 Localized swelling, mass and lump, left upper limb: Secondary | ICD-10-CM

## 2023-12-01 ENCOUNTER — Other Ambulatory Visit: Payer: Self-pay

## 2023-12-10 ENCOUNTER — Other Ambulatory Visit: Payer: Self-pay

## 2023-12-29 ENCOUNTER — Other Ambulatory Visit: Payer: Self-pay

## 2023-12-29 MED ORDER — MOUNJARO 12.5 MG/0.5ML ~~LOC~~ SOAJ
12.5000 mg | SUBCUTANEOUS | 1 refills | Status: AC
  Filled 2023-12-29 – 2023-12-30 (×2): qty 2, 28d supply, fill #0
  Filled 2024-01-22: qty 2, 28d supply, fill #1
  Filled 2024-02-18: qty 2, 28d supply, fill #2
  Filled 2024-03-19: qty 2, 28d supply, fill #3

## 2023-12-29 MED ORDER — MOUNJARO 12.5 MG/0.5ML ~~LOC~~ SOAJ
SUBCUTANEOUS | 1 refills | Status: AC
Start: 2023-12-29 — End: ?

## 2023-12-30 ENCOUNTER — Other Ambulatory Visit: Payer: Self-pay

## 2024-01-07 ENCOUNTER — Other Ambulatory Visit: Payer: Self-pay

## 2024-01-28 ENCOUNTER — Other Ambulatory Visit: Payer: Self-pay

## 2024-02-04 ENCOUNTER — Other Ambulatory Visit: Payer: Self-pay

## 2024-02-18 ENCOUNTER — Other Ambulatory Visit: Payer: Self-pay

## 2024-03-24 ENCOUNTER — Other Ambulatory Visit: Payer: Self-pay
